# Patient Record
Sex: Male | Born: 1960
Health system: Southern US, Community
[De-identification: ages and names within clinical notes are randomized; demographics above are authoritative.]

## PROBLEM LIST (undated history)

## (undated) DIAGNOSIS — I10 Essential (primary) hypertension: Secondary | ICD-10-CM

---

## 2020-04-10 ENCOUNTER — Emergency Department (HOSPITAL_BASED_OUTPATIENT_CLINIC_OR_DEPARTMENT_OTHER): Payer: Medicaid Other

## 2020-04-10 ENCOUNTER — Encounter (HOSPITAL_BASED_OUTPATIENT_CLINIC_OR_DEPARTMENT_OTHER): Payer: Self-pay

## 2020-04-10 ENCOUNTER — Emergency Department (HOSPITAL_BASED_OUTPATIENT_CLINIC_OR_DEPARTMENT_OTHER)
Admission: EM | Admit: 2020-04-10 | Discharge: 2020-04-10 | Disposition: A | Discharge status: Home / Self Care | Payer: Medicaid Other | Attending: Emergency Medicine | Admitting: Emergency Medicine

## 2020-04-10 ENCOUNTER — Other Ambulatory Visit: Payer: Self-pay

## 2020-04-10 DIAGNOSIS — I10 Essential (primary) hypertension: Secondary | ICD-10-CM

## 2020-04-10 DIAGNOSIS — Z87891 Personal history of nicotine dependence: Secondary | ICD-10-CM | POA: Diagnosis not present

## 2020-04-10 DIAGNOSIS — R072 Precordial pain: Secondary | ICD-10-CM | POA: Insufficient documentation

## 2020-04-10 HISTORY — DX: Essential (primary) hypertension: I10

## 2020-04-10 LAB — COMPREHENSIVE METABOLIC PANEL
ALT: 23 U/L (ref 0–44)
AST: 21 U/L (ref 15–41)
Albumin: 3.9 g/dL (ref 3.5–5.0)
Alkaline Phosphatase: 57 U/L (ref 38–126)
Anion gap: 12 (ref 5–15)
BUN: 17 mg/dL (ref 6–20)
CO2: 25 mmol/L (ref 22–32)
Calcium: 9.5 mg/dL (ref 8.9–10.3)
Chloride: 99 mmol/L (ref 98–111)
Creatinine, Ser: 1.19 mg/dL (ref 0.61–1.24)
GFR, Estimated: >60 mL/min (ref >60)
Glucose, Bld: 116 mg/dL — ABNORMAL HIGH (ref 70–99)
Potassium: 3.9 mmol/L (ref 3.5–5.1)
Sodium: 136 mmol/L (ref 135–145)
Total Bilirubin: 0.3 mg/dL (ref 0.3–1.2)
Total Protein: 7.4 g/dL (ref 6.5–8.1)

## 2020-04-10 LAB — CBC WITH DIFFERENTIAL/PLATELET
Abs Immature Granulocytes: 0.02 10*3/uL (ref 0.00–0.07)
Basophils Absolute: 0 10*3/uL (ref 0.0–0.1)
Basophils Relative: 0 %
Eosinophils Absolute: 0.3 10*3/uL (ref 0.0–0.5)
Eosinophils Relative: 3 %
HCT: 44.2 % (ref 39.0–52.0)
Hemoglobin: 14.9 g/dL (ref 13.0–17.0)
Immature Granulocytes: 0 %
Lymphocytes Relative: 23 %
Lymphs Abs: 2.3 10*3/uL (ref 0.7–4.0)
MCH: 31.7 pg (ref 26.0–34.0)
MCHC: 33.7 g/dL (ref 30.0–36.0)
MCV: 94 fL (ref 80.0–100.0)
Monocytes Absolute: 0.9 10*3/uL (ref 0.1–1.0)
Monocytes Relative: 9 %
Neutro Abs: 6.2 10*3/uL (ref 1.7–7.7)
Neutrophils Relative %: 65 %
Platelets: 368 10*3/uL (ref 150–400)
RBC: 4.7 MIL/uL (ref 4.22–5.81)
RDW: 12.6 % (ref 11.5–15.5)
WBC: 9.7 10*3/uL (ref 4.0–10.5)
nRBC: 0 % (ref 0.0–0.2)

## 2020-04-10 LAB — TROPONIN I (HIGH SENSITIVITY)
Troponin I (High Sensitivity): 7 ng/L (ref <18)
Troponin I (High Sensitivity): 8 ng/L (ref <18)

## 2020-04-10 MED ORDER — AMLODIPINE BESYLATE 10 MG PO TABS: 10.0000 mg | ORAL_TABLET | Freq: Every day | ORAL | 1 refills | Status: AC

## 2020-04-10 MED ORDER — ASPIRIN 81 MG PO CHEW
324.0000 mg | CHEWABLE_TABLET | Freq: Once | ORAL | Status: AC
  Administered 2020-04-10: 324 mg via ORAL
  Filled 2020-04-10: qty 4

## 2020-04-10 NOTE — ED Triage Notes (Signed)
Pt arrives with reports of high BP at home 171/122 states that he has been having burning CP with "burning pain" in this teeth. Pt reports this started about an hour ago.

## 2020-04-10 NOTE — Discharge Instructions (Addendum)
Start taking the Norvasc as directed daily.  Return for any recurrent chest pain or any new or worse chest pain.  Recommend taking a baby aspirin a day.  Make an appointment to follow-up with cardiology.

## 2020-04-10 NOTE — ED Provider Notes (Signed)
[facility] EMERGENCY DEPARTMENT Provider Note   CSN: [id number] Arrival date & time: 04/10/20  1213     History Chief Complaint  Patient presents with  . Chest Pain    John Daugherty is a 60 y.o. male.  Patient from [facility].  Patient with onset of chest pain by 1.4 hours ago.  But the pain never completely resolved about 20 minutes ago.  Pain was across both sides of the chest described as burning did radiate up into his teeth.  No shortness of breath no nausea or vomiting.  No prior history of similar pain.  Patient's had a history of hypertension but has not been on any medications for months.  Patient is a former smoker quit 28 days ago.  At [facility] for alcohol abuse.        Past Medical History:  Diagnosis Date  . Hypertension     There are no problems to display for this patient.   History reviewed. No pertinent surgical history.     No family history on file.  Social History   Tobacco Use  . Smoking status: Former Games developer  . Smokeless tobacco: Never Used  . Tobacco comment: quit 28 days ago   Vaping Use  . Vaping Use: Never used  Substance Use Topics  . Alcohol use: Yes    Comment: at [facility] now for ETOH Abuse  . Drug use: Yes    Types: Marijuana    Home Medications Prior to Admission medications   Not on File    Allergies    Patient has no known allergies.  Review of Systems   Review of Systems  Constitutional: Negative for chills and fever.  HENT: Negative for congestion, rhinorrhea and sore throat.   Eyes: Negative for visual disturbance.  Respiratory: Negative for cough and shortness of breath.   Cardiovascular: Positive for chest pain. Negative for leg swelling.  Gastrointestinal: Negative for abdominal pain, diarrhea, nausea and vomiting.  Genitourinary: Negative for dysuria.  Musculoskeletal: Negative for back pain and neck pain.  Skin: Negative for rash.  Neurological: Negative for dizziness, light-headedness and headaches.   Hematological: Does not bruise/bleed easily.  Psychiatric/Behavioral: Negative for confusion.    Physical Exam Updated Vital Signs BP (!) 159/107 (BP Location: Right Arm)   Pulse 80   Temp 98.8 F (37.1 C) (Oral)   Resp 17   Ht 1.88 m (6\' 2" )   Wt 135.2 kg   SpO2 95%   BMI 38.26 kg/m   Physical Exam Vitals and nursing note reviewed.  Constitutional:      Appearance: Normal appearance. He is well-developed and well-nourished.  HENT:     Head: Normocephalic and atraumatic.  Eyes:     Extraocular Movements: Extraocular movements intact.     Conjunctiva/sclera: Conjunctivae normal.     Pupils: Pupils are equal, round, and reactive to light.  Cardiovascular:     Rate and Rhythm: Normal rate and regular rhythm.     Heart sounds: No murmur heard.   Pulmonary:     Effort: Pulmonary effort is normal. No respiratory distress.     Breath sounds: Normal breath sounds.  Chest:     Chest wall: No tenderness.  Abdominal:     Palpations: Abdomen is soft.     Tenderness: There is no abdominal tenderness.  Musculoskeletal:        General: No swelling or edema. Normal range of motion.     Cervical back: Normal range of motion and neck supple.  Skin:    General: Skin is warm and dry.     Capillary Refill: Capillary refill takes less than 2 seconds.  Neurological:     General: No focal deficit present.     Mental Status: He is alert and oriented to person, place, and time.     Cranial Nerves: No cranial nerve deficit.     Sensory: No sensory deficit.     Motor: No weakness.  Psychiatric:        Mood and Affect: Mood and affect normal.     ED Results / Procedures / Treatments   Labs (all labs ordered are listed, but only abnormal results are displayed) Labs Reviewed  CBC WITH DIFFERENTIAL/PLATELET  COMPREHENSIVE METABOLIC PANEL  TROPONIN I (HIGH SENSITIVITY)    EKG EKG Interpretation  Date/Time:  Monday April 10 2020 12:24:11 EST Ventricular Rate:  79 PR  Interval:    QRS Duration: 105 QT Interval:  388 QTC Calculation: 445 R Axis:   37 Text Interpretation: Sinus rhythm No previous ECGs available Confirmed by Vanetta Mulders [phone]) on 04/10/2020 12:26:39 PM   Radiology No results found.  Procedures Procedures   Medications Ordered in ED Medications  aspirin chewable tablet 324 mg (has no administration in time range)    ED Course  I have reviewed the triage vital signs and the nursing notes.  Pertinent labs & imaging results that were available during my care of the patient were reviewed by me and considered in my medical decision making (see chart for details).    MDM Rules/Calculators/A&P                           Chest pain has resolved.  EKG without acute changes.  We will do serial troponins.  Continue cardiac monitoring.  Give aspirin.  We will get labs and chest x-ray.  Patient is hypertensive here had a past history of hypertension.  Will probably discharge him on Norvasc.  Patient's initial labs all very normal.  Including a troponin of 7.  Delta troponins pending.  Chest x-ray negative.  EKG without acute changes.  Patient's remained chest pain-free.  If second troponin is not significantly elevated patient can be discharged back to [facility].  And we will go and put in prescription for Norvasc for him.  We will also make referral to cardiology     Final Clinical Impression(s) / ED Diagnoses Final diagnoses:  Precordial pain    Rx / DC Orders ED Discharge Orders    None       Vanetta Mulders, MD 04/10/20 1438

## 2020-04-11 MED FILL — AMLODIPINE BESYLATE 10 MG T: 10 | 30 days supply | Qty: 30 | Fill #0

## 2020-04-17 DIAGNOSIS — I10 Essential (primary) hypertension: Secondary | ICD-10-CM | POA: Insufficient documentation

## 2020-04-18 ENCOUNTER — Ambulatory Visit: Payer: Medicaid Other | Admitting: Cardiology

## 2020-05-01 ENCOUNTER — Encounter: Payer: Self-pay | Admitting: General Practice

## 2020-05-25 ENCOUNTER — Encounter: Payer: Self-pay | Admitting: General Practice

## 2020-06-20 ENCOUNTER — Other Ambulatory Visit: Payer: Self-pay

## 2020-06-20 ENCOUNTER — Telehealth: Payer: Self-pay | Admitting: Nurse Practitioner

## 2020-06-20 ENCOUNTER — Ambulatory Visit: Payer: Medicaid Other | Attending: Nurse Practitioner | Admitting: Nurse Practitioner

## 2020-06-20 NOTE — Telephone Encounter (Signed)
LVM to return call to office. Patient is a residence of [facility] at this time.

## 2022-03-04 IMAGING — DX DG CHEST 1V PORT
2 series · 2 of 2 positions shown · non-contrast
Comparison: None.

CLINICAL DATA: Chest pain and hypertension

EXAM:
PORTABLE CHEST 1 VIEW

[chest ap (1 of 2)]
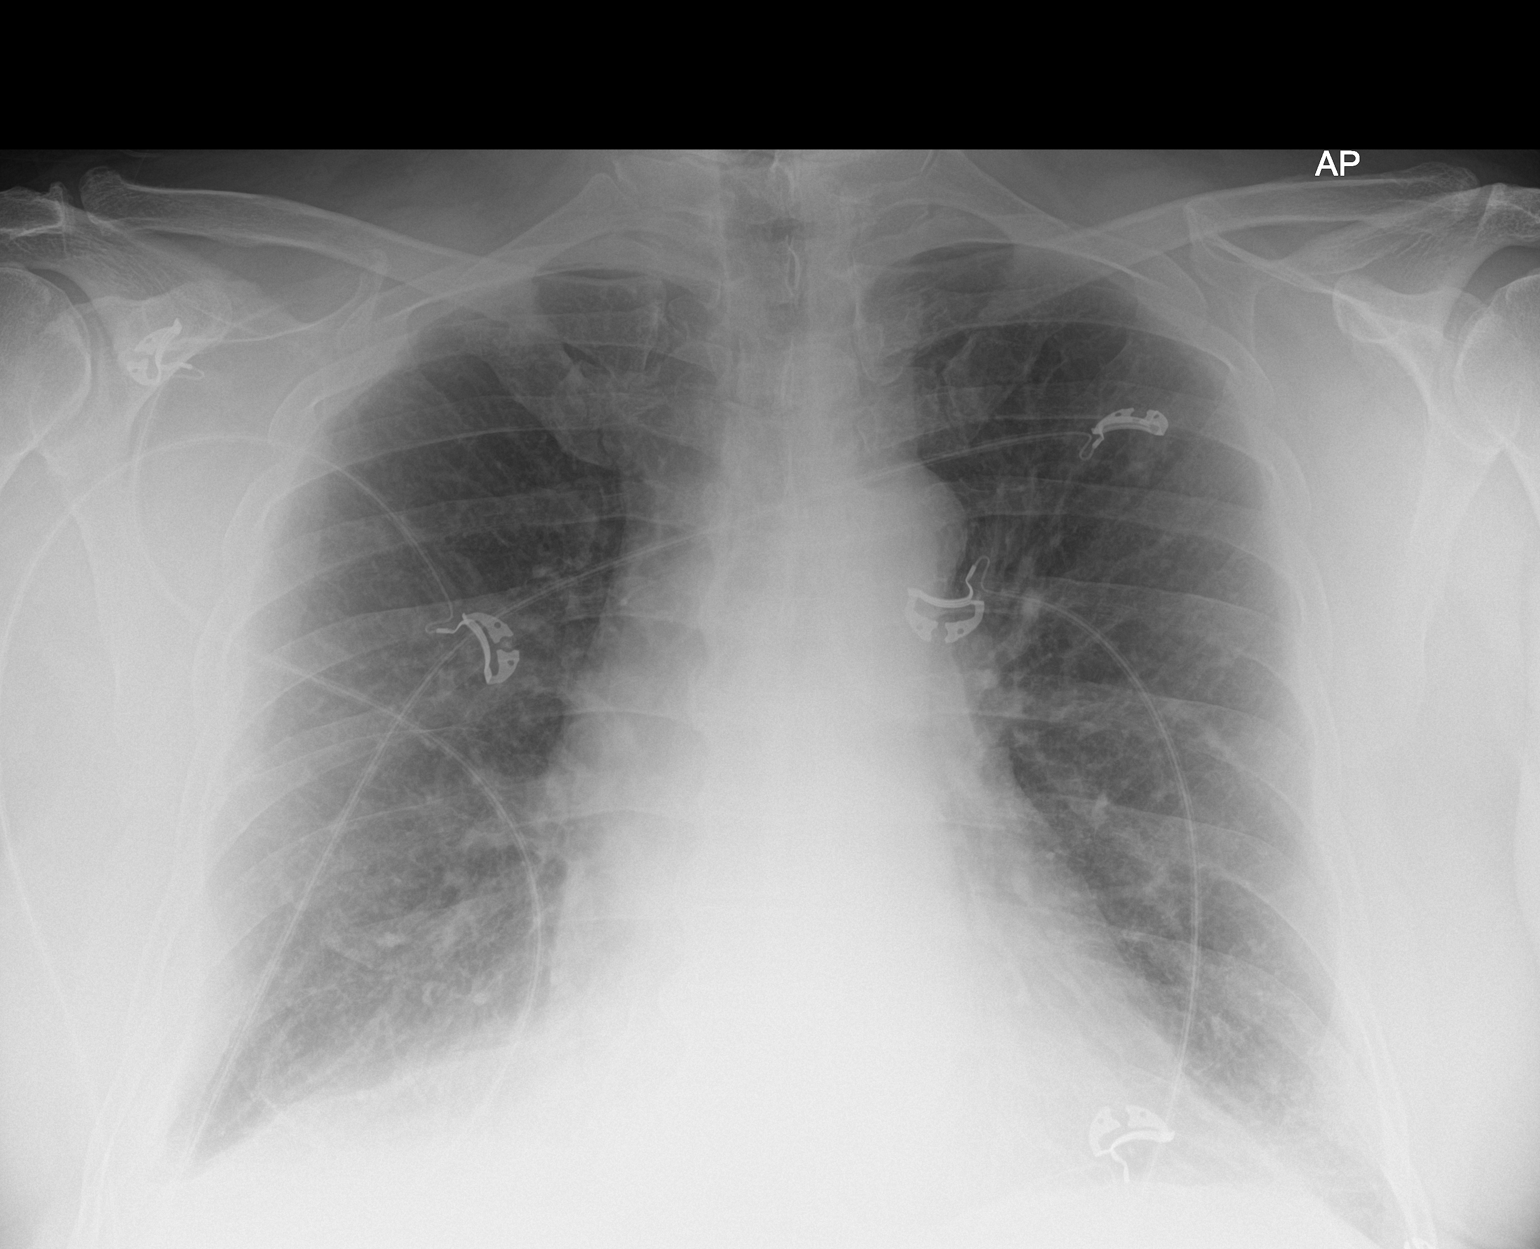

[chest ap (2 of 2)]
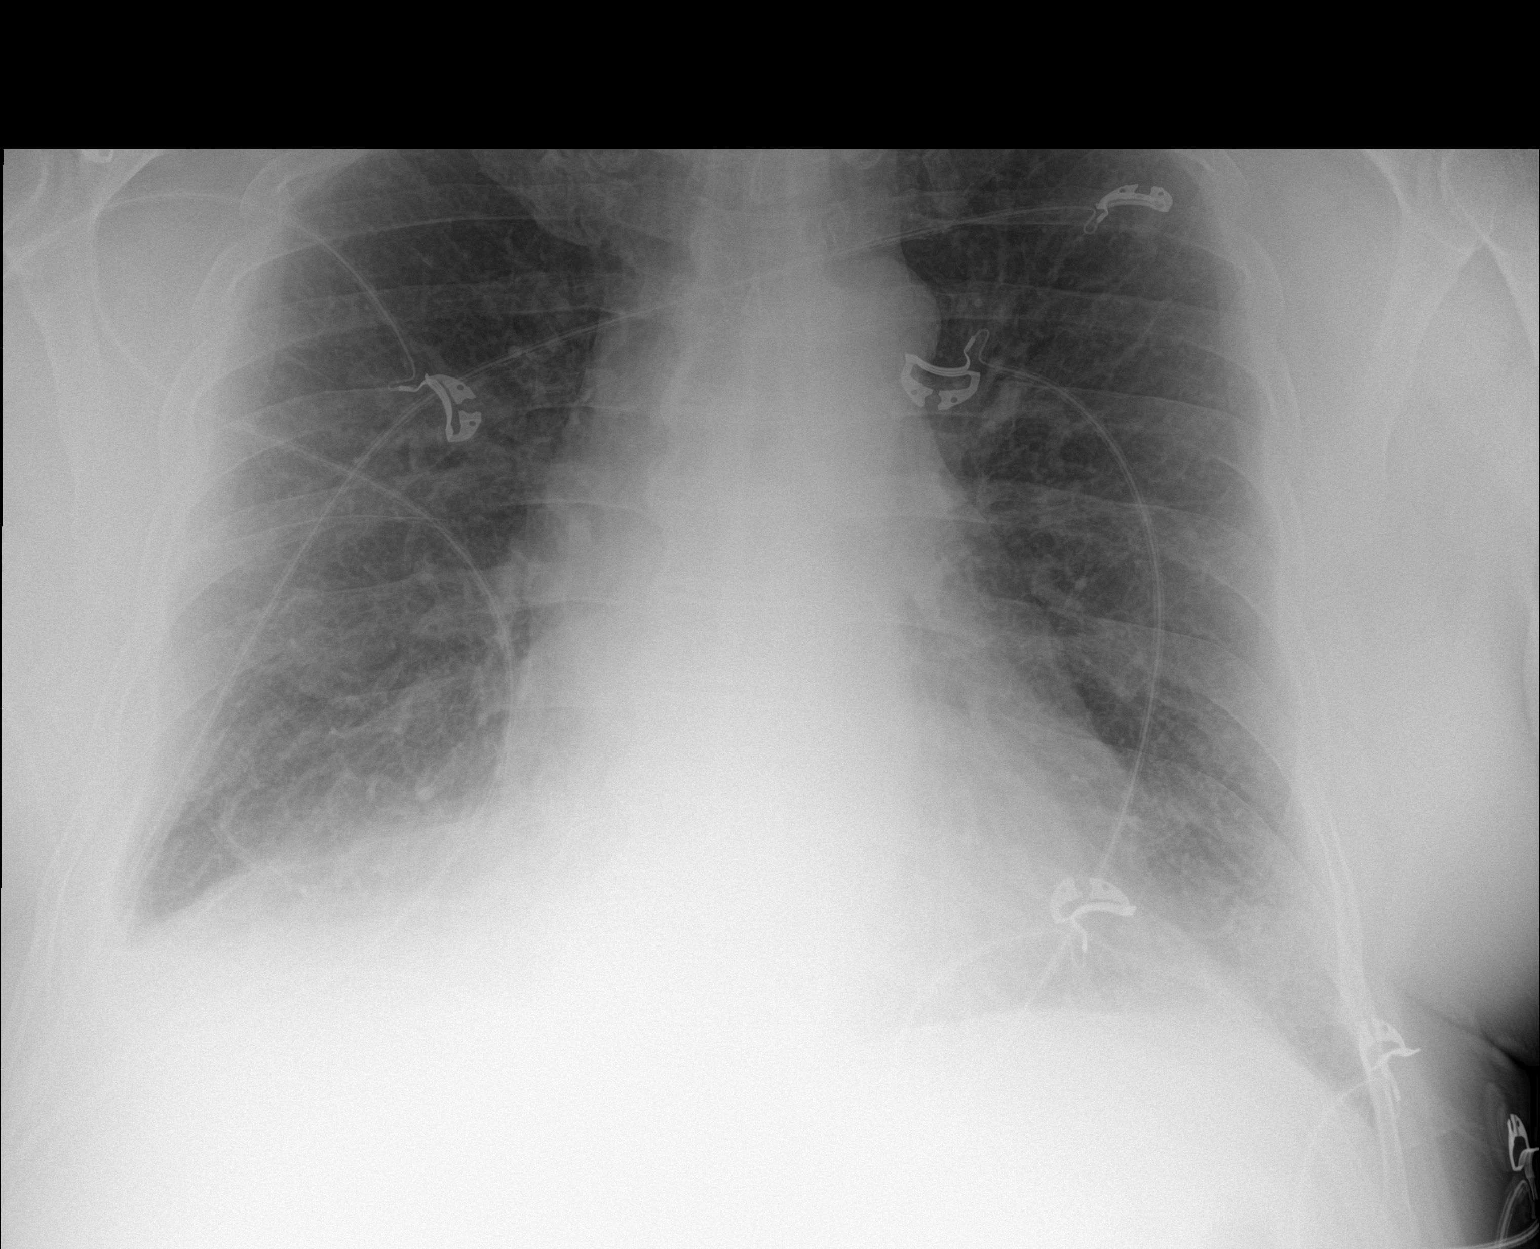

[2 of 2 positions shown; findings below may reference images not displayed]

FINDINGS: Lungs are clear. Heart is upper normal in size with pulmonary
vascularity within normal limits. No adenopathy. No bone lesions. No
pneumothorax.
IMPRESSION: Lungs clear.  Heart upper normal in size.
